# Patient Record
Sex: Female | Born: 1990 | Race: White | Hispanic: No | Marital: Single | Smoking: Never smoker
Health system: Southern US, Community
[De-identification: ages and names within clinical notes are randomized; demographics above are authoritative.]

## PROBLEM LIST (undated history)

## (undated) DIAGNOSIS — R202 Paresthesia of skin: Secondary | ICD-10-CM

## (undated) DIAGNOSIS — R251 Tremor, unspecified: Secondary | ICD-10-CM

## (undated) DIAGNOSIS — G43909 Migraine, unspecified, not intractable, without status migrainosus: Secondary | ICD-10-CM

## (undated) HISTORY — PX: OTHER SURGICAL HISTORY: SHX169

## (undated) HISTORY — PX: WISDOM TOOTH EXTRACTION: SHX21

## (undated) HISTORY — DX: Paresthesia of skin: R20.2

## (undated) HISTORY — DX: Migraine, unspecified, not intractable, without status migrainosus: G43.909

## (undated) HISTORY — DX: Tremor, unspecified: R25.1

---

## 2016-10-28 ENCOUNTER — Encounter: Payer: Self-pay | Admitting: Neurology

## 2016-10-28 ENCOUNTER — Ambulatory Visit (INDEPENDENT_AMBULATORY_CARE_PROVIDER_SITE_OTHER): Payer: BLUE CROSS/BLUE SHIELD | Admitting: Neurology

## 2016-10-28 VITALS — BP 90/60 | HR 98 | Ht 66.0 in | Wt 116.6 lb

## 2016-10-28 DIAGNOSIS — R202 Paresthesia of skin: Secondary | ICD-10-CM

## 2016-10-28 DIAGNOSIS — G44229 Chronic tension-type headache, not intractable: Secondary | ICD-10-CM | POA: Diagnosis not present

## 2016-10-28 DIAGNOSIS — R292 Abnormal reflex: Secondary | ICD-10-CM | POA: Diagnosis not present

## 2016-10-28 DIAGNOSIS — R5383 Other fatigue: Secondary | ICD-10-CM

## 2016-10-28 DIAGNOSIS — R5381 Other malaise: Secondary | ICD-10-CM | POA: Diagnosis not present

## 2016-10-28 NOTE — Patient Instructions (Addendum)
1.  MRI brain wwo contrast 2.  NCS/EMG of the arms 3.  We will call you with the results of your testing

## 2016-10-28 NOTE — Progress Notes (Signed)
[facility] Clinic Note - Initial Visit   Date: 10/28/16  Mary Andrews MRN: [id number] DOB: 1991-02-05   Dear Dr. Eather Colas:   Thank you for your kind referral of Mary Andrews for consultation of bilateral hand numbness. Although her history is well known to you, please allow [place] to reiterate it for the purpose of our medical record. The patient was accompanied to the clinic by self.    History of Present Illness: Mary Andrews is a 26 y.o. right-handed Caucasian female presenting for evaluation of bilateral hand numbness.    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  Around October 2017, she started having similar sensation over the forearm bilaterally and when this occurred, her legs would feel weak.  Symptoms are sporadic, occurring once every other month, lasting 1-2 hours, always occurring at work when she is standing and often in the early morning when she reaches work.  She does not have similar symptoms that wake her up from sleeping.  She does not have neck pain.  She does not have loss of consciousness or falls.  Symptoms are always symmetric and involve the hands and forearms in a circumferential manner.  She has not found anything that reduces the duration or intensity of symptoms.  She endorses having dull headaches.  No associated vision changes, nausea/vomiting.   She also complains of fatigue and sleepiness all the time.  She denies falling asleep during tasks such as watching TV or riding as a passenger in a car.  Fatigue has been present for many years.  She typically gets 7-10 hours of sleep nightly and reports to getting up 1-2 times per night because she is unable to falls asleep again.  She reports exercising 3 times per week.  She does not drink caffeinated beverages. Her mood is good.   Out-side paper records, electronic medical record, and images  have been reviewed where available and summarized as:  Labs 08/28/2016:  TSH 1.91, vitamin B12 498, vitamin D 34  Past Medical History:  None  Past Surgical History:  Procedure Laterality Date  . tubes in ears      Medications:  Outpatient Encounter Prescriptions as of 10/28/2016  Medication Sig  . Norgestim-Eth Estrad Triphasic (ORTHO TRI-CYCLEN, 28, PO) Take by mouth.   No facility-administered encounter medications on file as of 10/28/2016.      Allergies: No Known Allergies  Family History: Family History  Problem Relation Age of Onset  . Migraines Mother   . Asthma Brother   . Bipolar disorder Maternal Grandmother     Social History: Social History  Substance Use Topics  . Smoking status: Never Smoker  . Smokeless tobacco: Never Used  . Alcohol use Yes     Comment: Socially once per week   Social History   Social History Narrative   Lives in an apartment on the first floor.  Has no children.  Works as a Runner, broadcasting/film/video.  Education: college.    Review of Systems:  CONSTITUTIONAL: No fevers, chills, night sweats, or weight loss.   EYES: No visual changes or eye pain ENT: No hearing changes.  No history of nose bleeds.   RESPIRATORY: No cough, wheezing and shortness of breath.   CARDIOVASCULAR: Negative for chest pain, and palpitations.   GI: Negative for abdominal discomfort, blood in stools or black stools.  No recent change in bowel habits.  GU:  No history of incontinence.   MUSCLOSKELETAL: No history of joint pain or swelling.  No myalgias.   SKIN: Negative for lesions, rash, and itching.   HEMATOLOGY/ONCOLOGY: Negative for prolonged bleeding, bruising easily, and swollen nodes.  No history of cancer.   ENDOCRINE: Negative for cold or heat intolerance, polydipsia or goiter.   PSYCH:  No depression or anxiety symptoms.   NEURO: As Above.   Vital Signs:  BP 90/60   Pulse 98   Ht 5\' 6"  (1.676 m)   Wt 116 lb 9 oz (52.9 kg)   SpO2 99%   BMI 18.81 kg/m     General Medical Exam:   General:  Well appearing, comfortable.   Eyes/ENT: see cranial nerve examination.   Neck: No masses appreciated.  Full range of motion without tenderness.  No carotid bruits. Respiratory:  Clear to auscultation, good air entry bilaterally.   Cardiac:  Regular rate and rhythm, no murmur.   Extremities:  No deformities, edema, or skin discoloration.  Skin:  No rashes or lesions.  Neurological Exam: MENTAL STATUS including orientation to time, place, person, recent and remote memory, attention span and concentration, language, and fund of knowledge is normal.  Speech is not dysarthric.  CRANIAL NERVES: II:  No visual field defects.  Unremarkable fundi.   III-IV-VI: Pupils equal round and reactive to light.  Normal conjugate, extra-ocular eye movements in all directions of gaze.  No nystagmus.  No ptosis.   V:  Normal facial sensation.     VII:  Normal facial symmetry and movements.  VIII:  Normal hearing and vestibular function.   IX-X:  Normal palatal movement.   XI:  Normal shoulder shrug and head rotation.   XII:  Normal tongue strength and range of motion, no deviation or fasciculation.  MOTOR:  No atrophy, fasciculations or abnormal movements.  No pronator drift.  Tone is normal.    Right Upper Extremity:    Left Upper Extremity:    Deltoid  5/5   Deltoid  5/5   Biceps  5/5   Biceps  5/5   Triceps  5/5   Triceps  5/5   Wrist extensors  5/5   Wrist extensors  5/5   Wrist flexors  5/5   Wrist flexors  5/5   Finger extensors  5/5   Finger extensors  5/5   Finger flexors  5/5   Finger flexors  5/5   Dorsal interossei  5/5   Dorsal interossei  5/5   Abductor pollicis  5/5   Abductor pollicis  5/5   Tone (Ashworth scale)  0  Tone (Ashworth scale)  0   Right Lower Extremity:    Left Lower Extremity:    Hip flexors  5/5   Hip flexors  5/5   Hip extensors  5/5   Hip extensors  5/5   Knee flexors  5/5   Knee flexors  5/5   Knee extensors  5/5   Knee  extensors  5/5   Dorsiflexors  5/5   Dorsiflexors  5/5   Plantarflexors  5/5   Plantarflexors  5/5   Toe extensors  5/5   Toe extensors  5/5   Toe flexors  5/5   Toe flexors  5/5   Tone (Ashworth scale)  0  Tone (Ashworth scale)  0   MSRs:  Brisk 3+/4 reflexes throughout.  Plantars are down going.  There is no ankle clonus.  SENSORY:  Normal and symmetric perception of light  touch, pinprick, vibration, and proprioception.  Romberg's sign absent. Tinel's sign at the wrist and elbow is negative bilaterally.   COORDINATION/GAIT: Normal finger-to- nose-finger and heel-to-shin.  Intact rapid alternating movements bilaterally.  Able to rise from a chair without using arms.  Gait narrow based and stable. Tandem and stressed gait intact.    IMPRESSION: Mary Andrews is a 26 year-old female referred for evaluation of intermittent paresthesias of the upper extremities and chronic fatigue.  Her neurological exam shows brisk and symmetric reflexes throughout, which may be normal for patient's age, especially in the absence of other upper neuron findings. Motor and sensory exam is normal.  Her paresthesias are very diffuse and does not conform to a dermatomal or cutaneous nerve distribution.  Because of her patchy symptoms and brisk reflexes, MRI of the brain wwo contrast will be ordered to exclude demyelinating disease.  If this is negative, the next step is NCS/EMG of the upper extremities, but my overall suspicion for an entrapment neuropathy is low.    Further recommendations will be based on the results of her testing.   The duration of this appointment visit was 40 minutes of face-to-face time with the patient.  Greater than 50% of this time was spent in counseling, explanation of diagnosis, planning of further management, and coordination of care.   Thank you for allowing me to participate in patient's care.  If I can answer any additional questions, I would be pleased to do so.     Sincerely,    Donika K. Allena KatzPatel, DO

## 2016-11-05 ENCOUNTER — Telehealth: Payer: Self-pay | Admitting: *Deleted

## 2016-11-05 ENCOUNTER — Ambulatory Visit (INDEPENDENT_AMBULATORY_CARE_PROVIDER_SITE_OTHER): Payer: BLUE CROSS/BLUE SHIELD | Admitting: Neurology

## 2016-11-05 ENCOUNTER — Ambulatory Visit
Admission: RE | Admit: 2016-11-05 | Discharge: 2016-11-05 | Disposition: A | Discharge status: Home / Self Care | Payer: BLUE CROSS/BLUE SHIELD | Source: Ambulatory Visit | Attending: Neurology | Admitting: Neurology

## 2016-11-05 DIAGNOSIS — R5381 Other malaise: Secondary | ICD-10-CM

## 2016-11-05 DIAGNOSIS — R292 Abnormal reflex: Secondary | ICD-10-CM

## 2016-11-05 DIAGNOSIS — R202 Paresthesia of skin: Secondary | ICD-10-CM | POA: Diagnosis not present

## 2016-11-05 DIAGNOSIS — G44229 Chronic tension-type headache, not intractable: Secondary | ICD-10-CM

## 2016-11-05 DIAGNOSIS — R5383 Other fatigue: Secondary | ICD-10-CM

## 2016-11-05 MED ORDER — GADOBENATE DIMEGLUMINE 529 MG/ML IV SOLN
10.0000 mL | Freq: Once | INTRAVENOUS | Status: AC | PRN
  Administered 2016-11-05: 10 mL via INTRAVENOUS

## 2016-11-05 NOTE — Telephone Encounter (Signed)
Patient given the results and instructions.   

## 2016-11-05 NOTE — Procedures (Signed)
[facility]  [address], [address]  [place], [place] [address] Tel: [phone] Fax:  [phone] Test Date:  11/05/2016  Patient: Mary Andrews DOB: 04-Jun-1991 Physician: Mary Andrews  Sex: Female Height: 5\' 6"  Ref Phys: Mary Andrews  ID#: [id number] Temp: 33.2C Technician: Mary Andrews   Patient Complaints: This is a 26 year old female referred for evaluation of bilateral hand and forearm paresthesias.   NCV & EMG Findings: Extensive electrodiagnostic testing of the right upper extremity and additional studies of the left shows:  1. Bilateral median, ulnar, and mixed palmer sensory responses are within normal limits. 2. Bilateral median and ulnar motor responses are within normal limits. 3. There is no evidence of active or chronic motor axon loss changes affecting any of the tested muscles. Motor unit configuration and recruitment pattern is within normal limits.  Impression: This is a normal study. In particular, there is no evidence of carpal tunnel syndrome or cervical radiculopathy affecting the upper extremities.    ___________________________ Mary Andrews    Nerve Conduction Studies Anti Sensory Summary Table   Stim Site NR Peak (ms) Norm Peak (ms) P-T Amp (V) Norm P-T Amp  Left Median Anti Sensory (2nd Digit)  Wrist    3.2 <3.3 74.2 >20  Right Median Anti Sensory (2nd Digit)  32.3C  Wrist    3.1 <3.3 66.5 >20  Left Ulnar Anti Sensory (5th Digit)  32.3C  Wrist    2.6 <3.0 64.8 >18  Right Ulnar Anti Sensory (5th Digit)  32.3C  Wrist    2.8 <3.0 64.2 >18   Motor Summary Table   Stim Site NR Onset (ms) Norm Onset (ms) O-P Amp (mV) Norm O-P Amp Site1 Site2 Delta-0 (ms) Dist (cm) Vel (m/s) Norm Vel (m/s)  Left Median Motor (Abd Poll Brev)  Wrist    3.0 <3.9 8.2 >6 Elbow Wrist 3.5 21.0 60 >51  Elbow    6.5  8.2         Right Median Motor (Abd Poll Brev)  32.3C  Wrist    2.3 <3.9 12.3 >6 Elbow Wrist 4.5 24.0 53 >51  Elbow    6.8  12.3          Left Ulnar Motor (Abd Dig Minimi)  Wrist    2.7 <3.0 9.6 >8 B Elbow Wrist 2.7 15.0 56 >51  B Elbow    5.4  8.5  A Elbow B Elbow 1.6 10.0 63 >51  A Elbow    7.0  8.3         Right Ulnar Motor (Abd Dig Minimi)  32.3C  Wrist    2.9 <3.0 9.0 >8 B Elbow Wrist 3.0 16.0 53 >51  B Elbow    5.9  8.1  A Elbow B Elbow 1.6 10.0 63 >51  A Elbow    7.5  8.0          Comparison Summary Table   Stim Site NR Peak (ms) Norm Peak (ms) P-T Amp (V) Site1 Site2 Delta-P (ms) Norm Delta (ms)  Left Median/Ulnar Palm Comparison (Wrist - 8cm)  32.3C  Median Palm    1.5 <2.2 101.3 Median Palm Ulnar Palm 0.1   Ulnar Palm    1.4 <2.2 38.4      Right Median/Ulnar Palm Comparison (Wrist - 8cm)  32.3C  Median Palm    1.5 <2.2 85.1 Median Palm Ulnar Palm 0.1   Ulnar Palm    1.4 <2.2 17.3  EMG   Side Muscle Ins Act Fibs Psw Fasc Number Recrt Dur Dur. Amp Amp. Poly Poly. Comment  Right 1stDorInt Nml Nml Nml Nml Nml Nml Nml Nml Nml Nml Nml Nml N/A  Right Ext Indicis Nml Nml Nml Nml Nml Nml Nml Nml Nml Nml Nml Nml N/A  Right PronatorTeres Nml Nml Nml Nml Nml Nml Nml Nml Nml Nml Nml Nml N/A  Right Biceps Nml Nml Nml Nml Nml Nml Nml Nml Nml Nml Nml Nml N/A  Right Triceps Nml Nml Nml Nml Nml Nml Nml Nml Nml Nml Nml Nml N/A  Right Deltoid Nml Nml Nml Nml Nml Nml Nml Nml Nml Nml Nml Nml N/A  Left 1stDorInt Nml Nml Nml Nml Nml Nml Nml Nml Nml Nml Nml Nml N/A  Left Ext Indicis Nml Nml Nml Nml Nml Nml Nml Nml Nml Nml Nml Nml N/A  Left PronatorTeres Nml Nml Nml Nml Nml Nml Nml Nml Nml Nml Nml Nml N/A  Left Biceps Nml Nml Nml Nml Nml Nml Nml Nml Nml Nml Nml Nml N/A  Left Triceps Nml Nml Nml Nml Nml Nml Nml Nml Nml Nml Nml Nml N/A  Left Deltoid Nml Nml Nml Nml Nml Nml Nml Nml Nml Nml Nml Nml N/A      Waveforms:

## 2016-11-05 NOTE — Telephone Encounter (Signed)
-----   Message from [place] Chardonika K Patel, DO sent at 11/05/2016  2:41 PM EST ----- Please inform patient that her MRI brain and nerve testing is both normal. Please reassure her that there was no evidence of anything worrisome causing her symptoms. I would recommend to monitor her symptoms and return to clinic if they are worsening. Thanks.

## 2018-01-24 IMAGING — MR MR HEAD WO/W CM
11 series · 48 of 48 positions shown · IV contrast (multihance)
Comparison: None.

CLINICAL DATA: Tingling in the arms and hands over the last 18
months. Some lower extremity weakness.

EXAM:
MRI HEAD WITHOUT AND WITH CONTRAST
TECHNIQUE: Multiplanar, multiecho pulse sequences of the brain and surrounding
structures were obtained without and with intravenous contrast.
CONTRAST:  10mL MULTIHANCE GADOBENATE DIMEGLUMINE 529 MG/ML IV SOLN

[Series 2: T1 · sagittal · 5.0mm · 0.45mm/px · 1 of 21 slices shown]
[im 1/21]
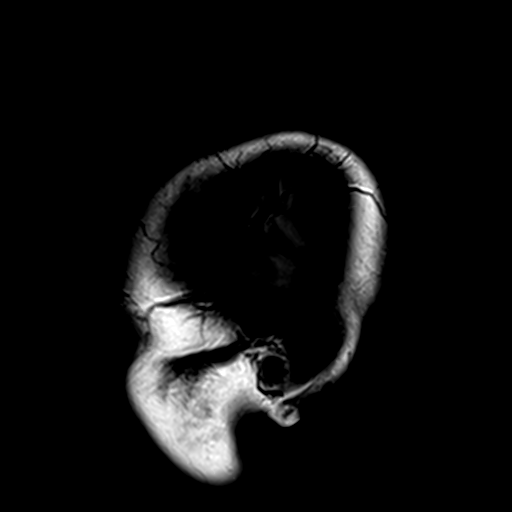

[Series 3: FLAIR · sagittal · 5.0mm · 0.45mm/px · 1 of 25 slices shown (1 of 2)]
[im 1/25]
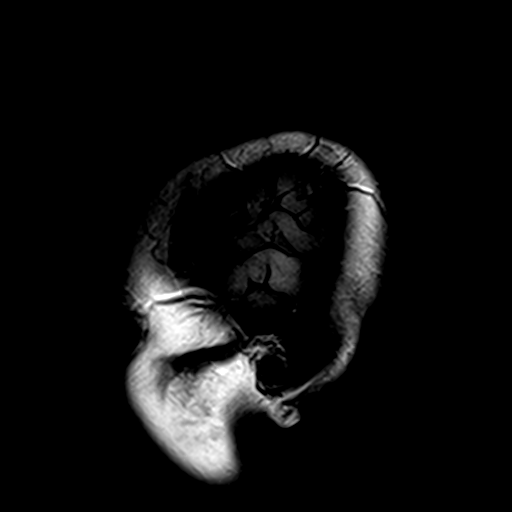

[Series 4: DWI · axial · 3.0mm · 1.80mm/px · z∈[-56,+91]mm · 7 of 100 slices shown (1 of 2)]
[im 1/100]
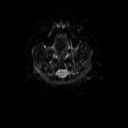
[im 17/100]
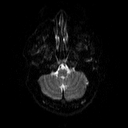
[im 34/100]
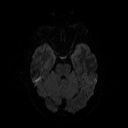
[im 50/100]
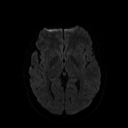
[im 67/100]
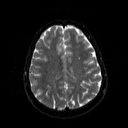
[im 83/100]
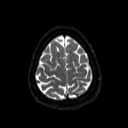
[im 100/100]
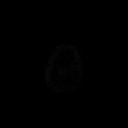

[Series 5: DWI · axial · 3.0mm · 1.80mm/px · z∈[-56,+91]mm · 4 of 50 slices shown (2 of 2)]
[im 1/50]
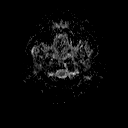
[im 17/50]
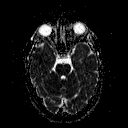
[im 33/50]
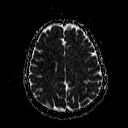
[im 50/50]
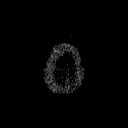

[Series 6: T2 · axial · 5.0mm · 0.51mm/px · z∈[-52,+89]mm · 2 of 22 slices shown (1 of 2)]
[im 1/22]
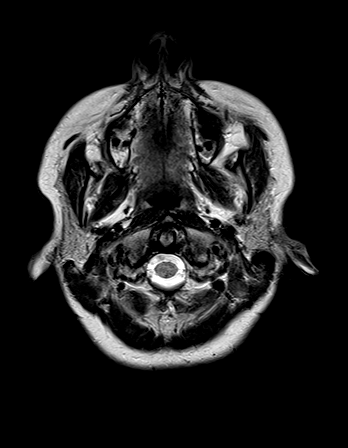
[im 22/22]
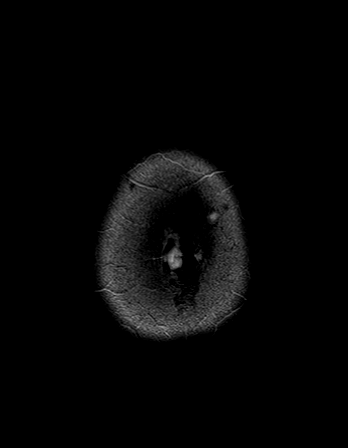

[Series 7: FLAIR · axial · 3.0mm · 0.45mm/px · z∈[-53,+88]mm · 3 of 48 slices shown (2 of 2)]
[im 1/48]
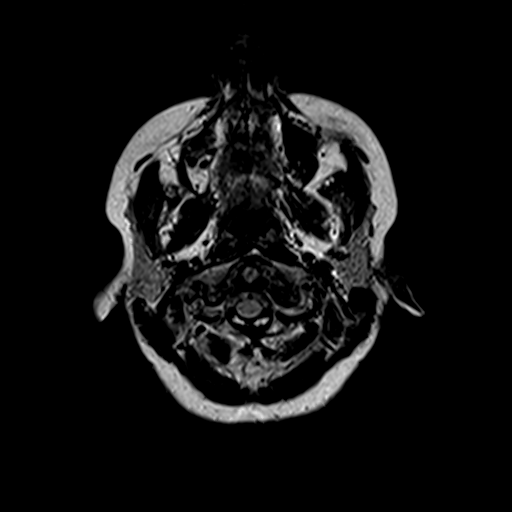
[im 24/48]
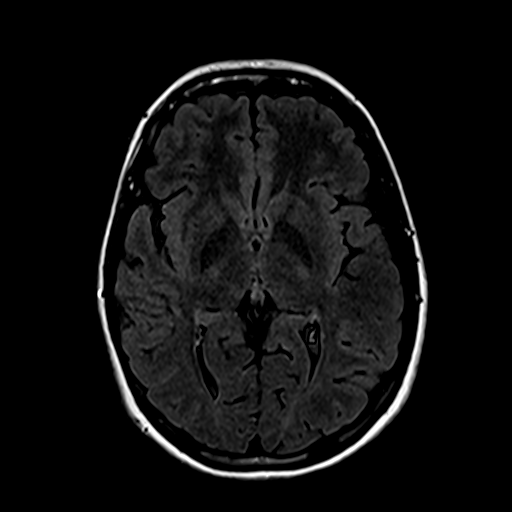
[im 48/48]
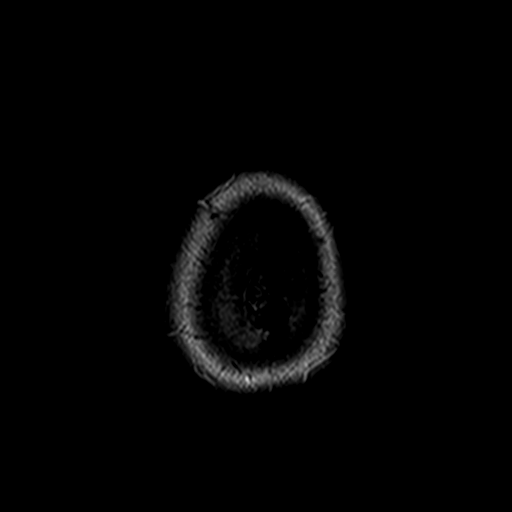

[Series 9: swi_images · axial · 2.0mm · 0.90mm/px · z∈[-61,+97]mm · 6 of 80 slices shown]
[im 1/80]
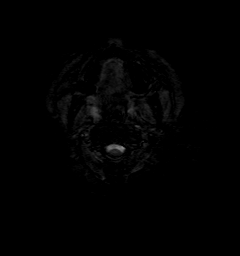
[im 16/80]
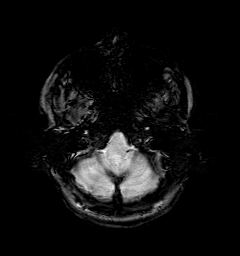
[im 32/80]
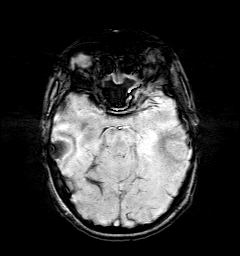
[im 48/80]
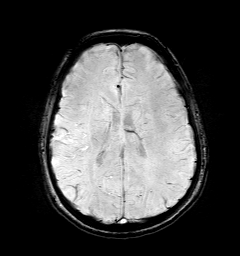
[im 64/80]
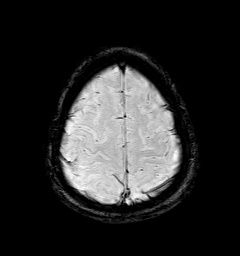
[im 80/80]
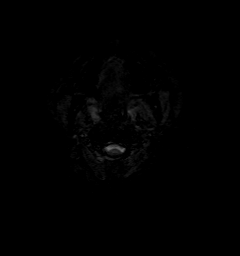

[Series 10: t1_mpr_tra · axial · 1.0mm · 0.75mm/px · z∈[-53,+90]mm · 10 of 144 slices shown (1 of 2)]
[im 1/144]
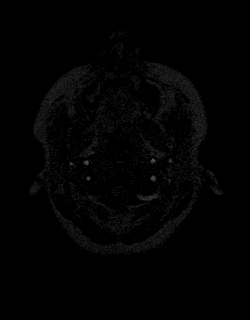
[im 16/144]
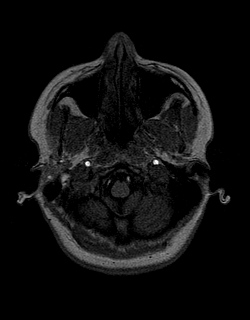
[im 32/144]
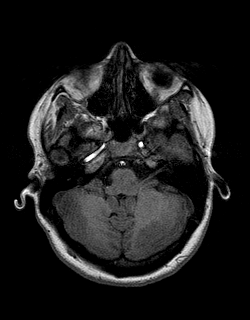
[im 48/144]
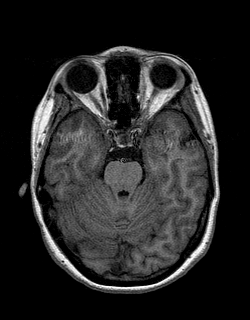
[im 64/144]
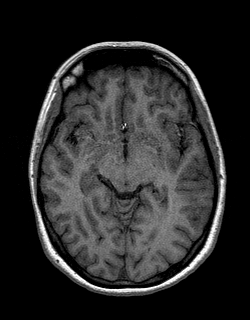
[im 80/144]
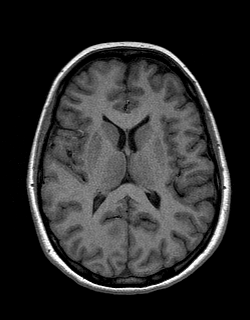
[im 96/144]
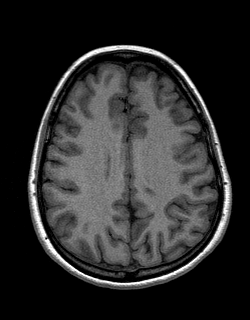
[im 112/144]
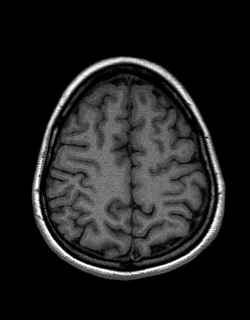
[im 128/144]
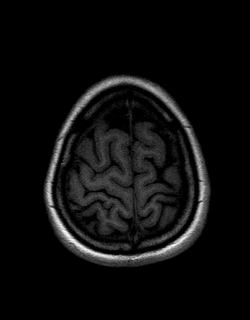
[im 144/144]
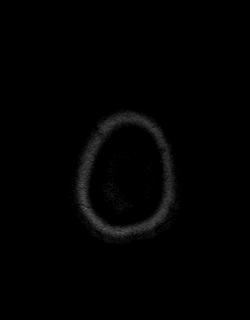

[Series 11: T2 · coronal · 5.0mm · 0.45mm/px · 2 of 25 slices shown (2 of 2)]
[im 1/25]
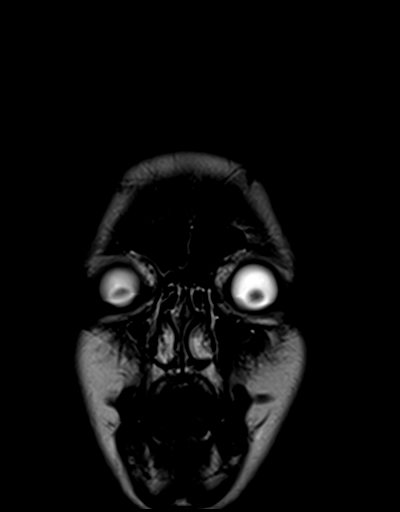
[im 25/25]
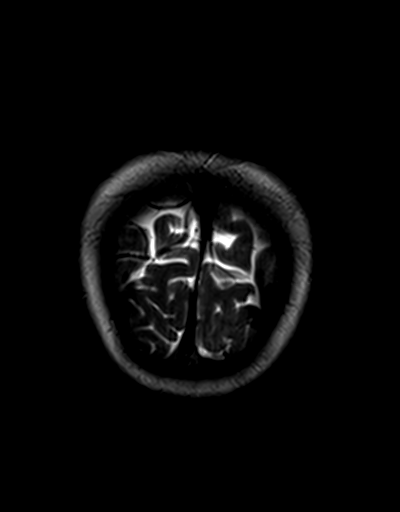

[Series 12: t1_mpr_tra · axial · 1.0mm · 0.75mm/px · z∈[-53,+90]mm · 10 of 144 slices shown (2 of 2)]
[im 1/144]
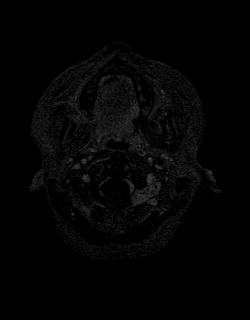
[im 16/144]
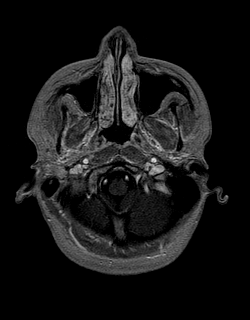
[im 32/144]
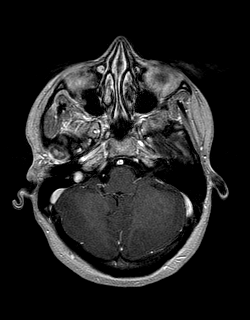
[im 48/144]
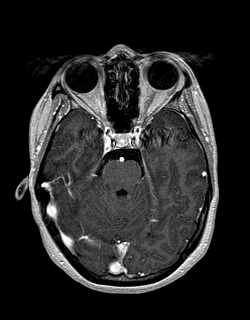
[im 64/144]
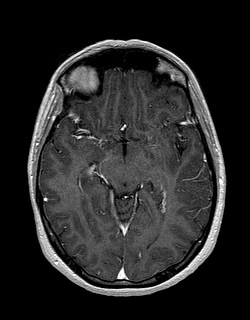
[im 80/144]
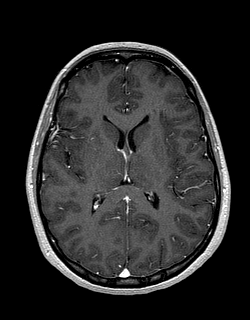
[im 96/144]
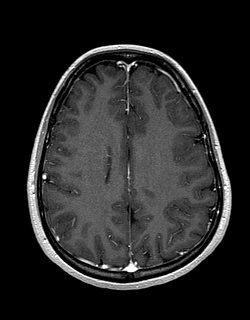
[im 112/144]
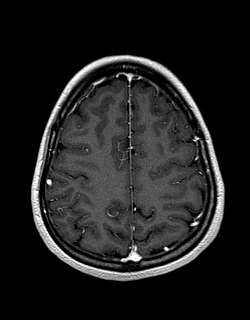
[im 128/144]
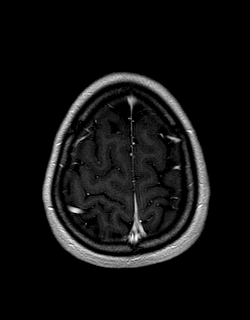
[im 144/144]
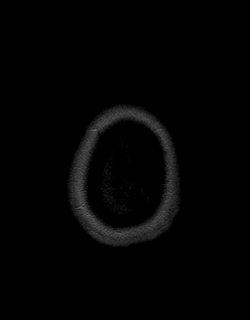

[Series 13: post cor · coronal · 5.0mm · 0.45mm/px · 2 of 25 slices shown]
[im 1/25]
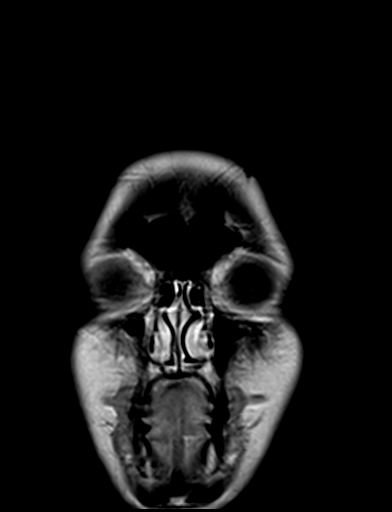
[im 25/25]
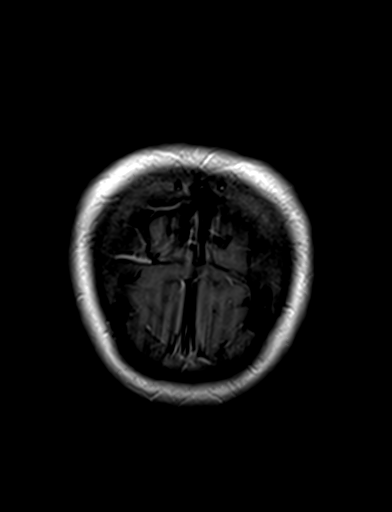

[48 of 48 positions shown; findings below may reference images not displayed]

FINDINGS: Brain: The brain has normal appearance without evidence of
malformation, atrophy, old or acute small or large vessel
infarction, hemorrhage, hydrocephalus or extra-axial collection. No
evidence of demyelinating disease. No pituitary abnormality. After
contrast administration, no abnormal enhancement occurs.

Vascular: Major vessels at the base of the brain show flow.

Skull and upper cervical spine: Normal

Sinuses/Orbits: Clear/ normal.

Other: None significant.
IMPRESSION: Normal examination. No abnormality seen to explain the presenting
symptoms.

## 2019-09-08 ENCOUNTER — Other Ambulatory Visit: Payer: Self-pay

## 2019-09-08 DIAGNOSIS — Z20822 Contact with and (suspected) exposure to covid-19: Secondary | ICD-10-CM

## 2019-09-11 LAB — NOVEL CORONAVIRUS, NAA: SARS-CoV-2, NAA: NOT DETECTED

## 2020-01-06 ENCOUNTER — Other Ambulatory Visit: Payer: Self-pay | Admitting: Family Medicine

## 2020-01-06 ENCOUNTER — Other Ambulatory Visit (HOSPITAL_COMMUNITY)
Admission: RE | Admit: 2020-01-06 | Discharge: 2020-01-06 | Disposition: A | Discharge status: Home / Self Care | Payer: BC Managed Care – PPO | Source: Ambulatory Visit | Attending: Family Medicine | Admitting: Family Medicine

## 2020-01-06 DIAGNOSIS — Z124 Encounter for screening for malignant neoplasm of cervix: Secondary | ICD-10-CM | POA: Insufficient documentation

## 2020-06-27 ENCOUNTER — Encounter: Payer: Self-pay | Admitting: Neurology

## 2020-06-27 ENCOUNTER — Ambulatory Visit: Payer: BC Managed Care – PPO | Admitting: Neurology

## 2020-06-27 ENCOUNTER — Other Ambulatory Visit: Payer: Self-pay

## 2020-06-27 VITALS — BP 110/78 | HR 69 | Ht 65.0 in | Wt 120.0 lb

## 2020-06-27 DIAGNOSIS — R251 Tremor, unspecified: Secondary | ICD-10-CM | POA: Diagnosis not present

## 2020-06-27 DIAGNOSIS — IMO0002 Reserved for concepts with insufficient information to code with codable children: Secondary | ICD-10-CM | POA: Insufficient documentation

## 2020-06-27 DIAGNOSIS — M79643 Pain in unspecified hand: Secondary | ICD-10-CM | POA: Insufficient documentation

## 2020-06-27 DIAGNOSIS — G43709 Chronic migraine without aura, not intractable, without status migrainosus: Secondary | ICD-10-CM | POA: Diagnosis not present

## 2020-06-27 NOTE — Progress Notes (Signed)
Chief Complaint  Patient presents with   New Patient (Initial Visit)    Reports tremor and pain in her bilateral hands (right worse than left). When the pain is present, she has difficulty gripping things.    PCP    Mary Soho, PA-C    HISTORICAL  Elleen Andrews, is a 29 year old right-handed female seen in request by her primary care PA Mary Andrews, for evaluation of constellation of complaints, intermittent bilateral hands tremor, hands joints pain, initial evaluation was on June 27, 2020  I reviewed and summarized the referring note.  Past medical history  Chronic migraine, she complains of frequent headaches couple times each week, take over-the-counter medication with limited help  Since 2019, she noticed intermittent bilateral hands tremor, but there was no limitation in her daily activity, she works at their [employer] officer, she denies difficulty using scissors, writing, there was no persistent sensory changes  She also complains of intermittent hands joints achiness, especially early morning, when she drives, she noticed knuckle pain, she has to straighten out her fingers, which she usually help her symptoms, she also complains of intermittent right fourth finger catch, but there was no significant pain, she denies significant joint swelling, she denies significant low back pain, shoulder blade area achiness, but denies radiating pain to bilateral upper extremity, no double vision, no bulbar weakness no gait abnormality Reported normal EMG nerve conduction study in 2017 from outside facility to evaluate for similar symptoms.  Laboratory evaluations in June 2021, mild elevated cholesterol, 205, LDL 120   REVIEW OF SYSTEMS: Full 14 system review of systems performed and notable only for as above All other review of systems were negative.  ALLERGIES: No Known Allergies  HOME MEDICATIONS: Current Outpatient Medications  Medication Sig Dispense Refill    Norgestim-Eth Estrad Triphasic (ORTHO TRI-CYCLEN, 28, PO) Take by mouth.     No current facility-administered medications for this visit.    PAST MEDICAL HISTORY: Past Medical History:  Diagnosis Date   Migraine    Paresthesia    Tremor     PAST SURGICAL HISTORY: Past Surgical History:  Procedure Laterality Date   tubes in ears     WISDOM TOOTH EXTRACTION      FAMILY HISTORY: Family History  Problem Relation Age of Onset   Migraines Mother    Arthritis Mother    Arthritis Father    Asthma Brother    Bipolar disorder Maternal Grandmother     SOCIAL HISTORY: Social History   Socioeconomic History   Marital status: Single    Spouse name: Not on file   Number of children: 0   Years of education: college   Highest education level: Not on file  Occupational History   Occupation: Runner, broadcasting/film/video  Tobacco Use   Smoking status: Never Smoker   Smokeless tobacco: Never Used  Substance and Sexual Activity   Alcohol use: Yes    Comment: social use   Drug use: Never   Sexual activity: Not on file  Other Topics Concern   Not on file  Social History Narrative   Lives alone.   Right-handed.   No daily use of caffeine.    Social Determinants of Health   Financial Resource Strain:    Difficulty of Paying Living Expenses: Not on file  Food Insecurity:    Worried About Programme researcher, broadcasting/film/video in the Last Year: Not on file   [employer] of Food in the Last Year: Not on file  Transportation Needs:  Lack of Transportation (Medical): Not on file   Lack of Transportation (Non-Medical): Not on file  Physical Activity:    Days of Exercise per Week: Not on file   Minutes of Exercise per Session: Not on file  Stress:    Feeling of Stress : Not on file  Social Connections:    Frequency of Communication with Friends and Family: Not on file   Frequency of Social Gatherings with Friends and Family: Not on file   Attends Religious Services: Not on file    Active Member of Clubs or Organizations: Not on file   Attends Banker Meetings: Not on file   Marital Status: Not on file  Intimate Partner Violence:    Fear of Current or Ex-Partner: Not on file   Emotionally Abused: Not on file   Physically Abused: Not on file   Sexually Abused: Not on file     PHYSICAL EXAM   Vitals:   06/27/20 1517  BP: 110/78  Pulse: 69  Weight: 120 lb (54.4 kg)  Height: 5\' 5"  (1.651 m)   Not recorded     Body mass index is 19.97 kg/m.  PHYSICAL EXAMNIATION:  Gen: NAD, conversant, well nourised, well groomed                     Cardiovascular: Regular rate rhythm, no peripheral edema, warm, nontender. Eyes: Conjunctivae clear without exudates or hemorrhage Neck: Supple, no carotid bruits. Pulmonary: Clear to auscultation bilaterally   NEUROLOGICAL EXAM:  MENTAL STATUS: Speech:    Speech is normal; fluent and spontaneous with normal comprehension.  Cognition:     Orientation to time, place and person     Normal recent and remote memory     Normal Attention span and concentration     Normal Language, naming, repeating,spontaneous speech     Fund of knowledge   CRANIAL NERVES: CN II: Visual fields are full to confrontation. Pupils are round equal and briskly reactive to light. CN III, IV, VI: extraocular movement are normal. No ptosis. CN V: Facial sensation is intact to light touch CN VII: Face is symmetric with normal eye closure  CN VIII: Hearing is normal to causal conversation. CN IX, X: Phonation is normal. CN XI: Head turning and shoulder shrug are intact  MOTOR: There is no pronator drift of out-stretched arms. Muscle bulk and tone are normal. Muscle strength is normal.  No joint swelling  REFLEXES: Reflexes are 2+ and symmetric at the biceps, triceps, knees, and ankles. Plantar responses are flexor.  SENSORY: Intact to light touch, pinprick and vibratory sensation are intact in fingers and  toes.  COORDINATION: There is no trunk or limb dysmetria noted.  GAIT/STANCE: Posture is normal. Gait is steady with normal steps, base, arm swing, and turning. Heel and toe walking are normal. Tandem gait is normal.  Romberg is absent.   DIAGNOSTIC DATA (LABS, IMAGING, TESTING) - I reviewed patient records, labs, notes, testing and imaging myself where available.   ASSESSMENT AND PLAN  Mary Andrews is a 29 y.o. 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 Ph.D.  [facility] [address] [place], [place] [address] Ph: [phone] Fax: [phone]  CC:  Mary Soho, PA-C [address] [place],  [place] [address]

## 2020-06-27 NOTE — Patient Instructions (Signed)
Magnesium oxide 400 mg twice a day Riboflavin= vitamin B2 100 mg twice a day as migraine prevention  Aleve as needed for moderate to severe headaches

## 2021-12-13 ENCOUNTER — Emergency Department (HOSPITAL_BASED_OUTPATIENT_CLINIC_OR_DEPARTMENT_OTHER)
Admission: EM | Admit: 2021-12-13 | Discharge: 2021-12-13 | Disposition: A | Discharge status: Home / Self Care | Payer: BC Managed Care – PPO | Attending: Emergency Medicine | Admitting: Emergency Medicine

## 2021-12-13 ENCOUNTER — Other Ambulatory Visit: Payer: Self-pay

## 2021-12-13 ENCOUNTER — Encounter (HOSPITAL_BASED_OUTPATIENT_CLINIC_OR_DEPARTMENT_OTHER): Payer: Self-pay

## 2021-12-13 ENCOUNTER — Emergency Department (HOSPITAL_BASED_OUTPATIENT_CLINIC_OR_DEPARTMENT_OTHER): Payer: BC Managed Care – PPO

## 2021-12-13 DIAGNOSIS — R519 Headache, unspecified: Secondary | ICD-10-CM | POA: Diagnosis present

## 2021-12-13 DIAGNOSIS — G43001 Migraine without aura, not intractable, with status migrainosus: Secondary | ICD-10-CM | POA: Insufficient documentation

## 2021-12-13 DIAGNOSIS — R202 Paresthesia of skin: Secondary | ICD-10-CM | POA: Insufficient documentation

## 2021-12-13 NOTE — ED Provider Notes (Signed)
?[facility] EMERGENCY DEPARTMENT ?Provider Note ? ? ?CSN: [id number] ?Arrival date & time: 12/13/21  1639 ? ?  ? ?History ? ?Chief Complaint  ?Patient presents with  ? Migraine  ? ? ?Japji Brathwaite is a 31 y.o. female. ? ?Patient is a 31 year old female with a significant past medical history of recurrent migraines, paresthesias and tremors who is presenting today with complaints of pain and tingling over the right side of her cheek and chin.  She reports that the symptoms started yesterday but have been more pronounced today.  She also reported yesterday she started getting a migraine across her forehead and now it is seem to settle over in her right temple.  She reports the symptoms with the face are unusual and she has never had that before.  She typically gets 2-3 migraines per week and reports on Sunday her migraine had a lot of wavy lines in her vision which is also a bit atypical but that had resolved until the symptoms that started yesterday.  She has not had any congestion, fever, swelling of her face.  She denies any tooth pain.  No new neck pain.  She spoke with her headache doctor today but they requested that she come here and have a scan to ensure that everything is normal before they give her specific medication for her migraine.  She denies any numbness or weakness in her arm.  She reported earlier today she had some pain in her leg but that has resolved and it is no longer present at this time.  She has had no trauma to her head. ? ?The history is provided by the patient.  ?Migraine ? ? ?  ? ?Home Medications ?Prior to Admission medications   ?Medication Sig Start Date End Date Taking? Authorizing Provider  ?Norgestim-Eth Estrad Triphasic (ORTHO TRI-CYCLEN, 28, PO) Take by mouth.    [provider]  ?   ? ?Allergies    ?Patient has no known allergies.   ? ?Review of Systems   ?Review of Systems ? ?Physical Exam ?Updated Vital Signs ?BP 125/90   Pulse 61   Temp 98.5 ?F (36.9 ?C)  (Oral)   Resp 18   Ht 5\' 7"  (1.702 m)   Wt 59 kg   LMP 12/13/2021   SpO2 100%   BMI 20.36 kg/m?  ?Physical Exam ?Vitals and nursing note reviewed.  ?Constitutional:   ?   General: She is not in acute distress. ?   Appearance: She is well-developed.  ?HENT:  ?   Head: Normocephalic and atraumatic.  ?   Comments: Mild tenderness with palpation to the right cheek and chin, no swelling, rashes or erythema noted ?   Right Ear: Tympanic membrane normal.  ?   Left Ear: Tympanic membrane normal.  ?   Nose: Nose normal.  ?   Mouth/Throat:  ?   Mouth: Mucous membranes are moist.  ?Eyes:  ?   General:     ?   Right eye: No discharge.     ?   Left eye: No discharge.  ?   Conjunctiva/sclera: Conjunctivae normal.  ?   Pupils: Pupils are equal, round, and reactive to light.  ?   Comments: photophobia  ?Neck:  ?   Meningeal: Brudzinski's sign and Kernig's sign absent.  ?Cardiovascular:  ?   Rate and Rhythm: Normal rate and regular rhythm.  ?   Heart sounds: Normal heart sounds. No murmur heard. ?  No friction rub.  ?Pulmonary:  ?  Effort: Pulmonary effort is normal. No respiratory distress.  ?   Breath sounds: Normal breath sounds. No wheezing or rales.  ?Abdominal:  ?   General: Bowel sounds are normal. There is no distension.  ?   Palpations: Abdomen is soft.  ?   Tenderness: There is no abdominal tenderness. There is no guarding or rebound.  ?Musculoskeletal:     ?   General: No tenderness. Normal range of motion.  ?   Cervical back: Normal range of motion and neck supple. No rigidity. No spinous process tenderness.  ?   Comments: No edema  ?Lymphadenopathy:  ?   Cervical: No cervical adenopathy.  ?Skin: ?   General: Skin is warm and dry.  ?   Capillary Refill: Capillary refill takes less than 2 seconds.  ?   Findings: No rash.  ?Neurological:  ?   Mental Status: She is alert and oriented to person, place, and time. Mental status is at baseline.  ?   GCS: GCS eye subscore is 4. GCS verbal subscore is 5. GCS motor subscore  is 6.  ?   Cranial Nerves: No cranial nerve deficit.  ?   Sensory: No sensory deficit.  ?   Motor: No weakness.  ?   Coordination: Coordination normal.  ?   Gait: Gait normal.  ?Psychiatric:     ?   Behavior: Behavior normal.  ? ? ?ED Results / Procedures / Treatments   ?Labs ?(all labs ordered are listed, but only abnormal results are displayed) ?Labs Reviewed - No data to display ? ?EKG ?None ? ?Radiology ?CT Head Wo Contrast ? ?Result Date: 12/13/2021 ?CLINICAL DATA:  Headache. EXAM: CT HEAD WITHOUT CONTRAST TECHNIQUE: Contiguous axial images were obtained from the base of the skull through the vertex without intravenous contrast. RADIATION DOSE REDUCTION: This exam was performed according to the departmental dose-optimization program which includes automated exposure control, adjustment of the mA and/or kV according to patient size and/or use of iterative reconstruction technique. COMPARISON:  None. FINDINGS: Brain: No evidence of acute infarction, hemorrhage, hydrocephalus, extra-axial collection or mass lesion/mass effect. Vascular: No hyperdense vessel or unexpected calcification. Skull: Normal. Negative for fracture or focal lesion. Sinuses/Orbits: No acute finding. Other: None. IMPRESSION: No acute intracranial abnormality seen. Electronically Signed   By: Lupita RaiderJames  Green Jr M.D.   On: 12/13/2021 20:22   ? ?Procedures ?Procedures  ? ? ?Medications Ordered in ED ?Medications - No data to display ? ?ED Course/ Medical Decision Making/ A&P ?  ?                        ?Medical Decision Making ?Amount and/or Complexity of Data Reviewed ?Radiology: ordered. ? ? ?Patient with a history of migraine headaches presenting today with atypical symptoms of a feeling like a burning pain in the right side of her face that does not cross midline.  As well as a headache.  She has no specific visual complaints at this time.  Vital signs are within normal limits and low suspicion at this time serve for subarachnoid hemorrhage,  venous dural thrombosis or meningitis.  Patient denies any sinus symptoms and low suspicion for orbital cellulitis or sinusitis causing her symptoms.  At this time no rashes present however did caution the patient to keep an eye out for rashes this could be early zoster.  Patient has a doctor who will be prescribing her medication for her headache but she was sent here to have a scan  of her head.  Patient does not wish to have any medications for her headache at this time. ? ?9:04 PM ?I independently visualized and interpreted patient's head CT with no evidence of acute bleed.  Radiology reports no acute findings.  These findings were discussed with the patient.  At this time she wishes to follow-up with her specialist tomorrow.  She does not want any treatment of her headache today.  She is otherwise neurovascularly intact and well-appearing. ? ? ? ? ? ? ? ?Final Clinical Impression(s) / ED Diagnoses ?Final diagnoses:  ?Migraine without aura and with status migrainosus, not intractable  ?Facial paresthesia  ? ? ?Rx / DC Orders ?ED Discharge Orders   ? ? None  ? ?  ? ? ?  ?Gwyneth Sprout, MD ?12/13/21 2105 ? ?

## 2021-12-13 NOTE — ED Triage Notes (Addendum)
Pt c/o migraine with vision changes and pain site changesstarted 3/5-yesterday c/o numbness/tingling to right side of face/chin-seen/sent by PCP-NAD-steady gait ?

## 2021-12-13 NOTE — Discharge Instructions (Signed)
Keep an eye out that you do not develop a rash on your face.  If you do you need to be seen by your doctor to get treatment for shingles. ?

## 2022-12-10 ENCOUNTER — Encounter: Payer: Self-pay | Admitting: Internal Medicine

## 2022-12-10 ENCOUNTER — Ambulatory Visit: Payer: BC Managed Care – PPO | Admitting: Internal Medicine

## 2022-12-10 VITALS — BP 121/75 | HR 76 | Ht 67.0 in | Wt 130.4 lb

## 2022-12-10 DIAGNOSIS — R000 Tachycardia, unspecified: Secondary | ICD-10-CM

## 2022-12-10 DIAGNOSIS — R7982 Elevated C-reactive protein (CRP): Secondary | ICD-10-CM

## 2022-12-10 DIAGNOSIS — R002 Palpitations: Secondary | ICD-10-CM

## 2022-12-10 DIAGNOSIS — R0609 Other forms of dyspnea: Secondary | ICD-10-CM

## 2022-12-10 MED ORDER — PROPRANOLOL HCL 10 MG PO TABS: 10.0000 mg | ORAL_TABLET | Freq: Three times a day (TID) | ORAL | 10 refills | Status: AC | PRN

## 2022-12-10 NOTE — Progress Notes (Signed)
Primary Physician/Referring:  Marda Stalker, PA-C  Patient ID: Mary Andrews, female    DOB: [birth year], 32 y.o.   MRN: [id number]  Chief Complaint  Patient presents with   Tachycardia   Shortness of Breath   New Patient (Initial Visit)        HPI:    Mary Andrews  is a 32 y.o. female with past medical history significant for migraines who is here to establish care with cardiology. She has been complaining of shortness of breath with exertion, tachycardia, and palpitations. She has been feeling this way since having covid many years ago. She feels like she still can't catch her breath all the way. She notices when she is at work and walks only a few feet, her heart rates go into the 120s. She denies cardiac history in herself. Her grandfather had Marfan's disease (I presume) as he had an aortic dissection when he was 31 years old. There is also family history of rheumatoid arthritis but patient herself has not been diagnosed with autoimmune disease. Patient denies chest pain, diaphoresis, syncope, edema, PND, orthopnea.   Past Medical History:  Diagnosis Date   Migraine    Paresthesia    Tremor    Past Surgical History:  Procedure Laterality Date   tubes in ears     WISDOM TOOTH EXTRACTION     Family History  Problem Relation Age of Onset   Migraines Mother    Arthritis Mother    Arthritis Father    Asthma Brother    Bipolar disorder Maternal Grandmother     Social History   Tobacco Use   Smoking status: Never   Smokeless tobacco: Never  Substance Use Topics   Alcohol use: Yes    Comment: weekly   Marital Status: Single  ROS  Review of Systems  Cardiovascular:  Positive for dyspnea on exertion, irregular heartbeat and palpitations.   Objective  Blood pressure 121/75, pulse 76, height '5\' 7"'$  (1.702 m), weight 130 lb 6.4 oz (59.1 kg), SpO2 94 %. Body mass index is 20.42 kg/m.     12/10/2022    2:53 PM 12/13/2021    9:19 PM 12/13/2021    8:00 PM  Vitals with BMI   Height '5\' 7"'$     Weight 130 lbs 6 oz    BMI [id number]    Systolic [id number] [id number]  Diastolic 75 86 90  Pulse 76 66 61     Physical Exam Vitals reviewed.  HENT:     Head: Normocephalic and atraumatic.  Cardiovascular:     Rate and Rhythm: Normal rate and regular rhythm.     Pulses: Normal pulses.     Heart sounds: Normal heart sounds. No murmur heard. Pulmonary:     Effort: Pulmonary effort is normal.     Breath sounds: Normal breath sounds.  Abdominal:     General: Bowel sounds are normal.  Musculoskeletal:     Right lower leg: No edema.     Left lower leg: No edema.  Skin:    General: Skin is warm and dry.  Neurological:     Mental Status: She is alert.     Medications and allergies  No Known Allergies   Medication list after today's encounter   Current Outpatient Medications:    Lysine HCl 1000 MG TABS, 1 tablet Orally Once a day for 30 day(s), Disp: , Rfl:    Norgestim-Eth Estrad Triphasic (ORTHO TRI-CYCLEN, 28, PO), Take by mouth., Disp: , Rfl:    propranolol (INDERAL)  10 MG tablet, Take 1 tablet (10 mg total) by mouth 3 (three) times daily as needed., Disp: 90 tablet, Rfl: 10   VALTREX 1 g tablet, Take 500 mg by mouth as needed., Disp: , Rfl:   Laboratory examination:   No results found for: "NA", "K", "CO2", "GLUCOSE", "BUN", "CREATININE", "CALCIUM", "EGFR", "GFRNONAA"      No data to display             No data to display          Lipid Panel No results for input(s): "CHOL", "TRIG", "LDLCALC", "VLDL", "HDL", "CHOLHDL", "LDLDIRECT" in the last 8760 hours.  HEMOGLOBIN A1C No results found for: "HGBA1C", "MPG" TSH No results for input(s): "TSH" in the last 8760 hours.  External labs:   TSH normal ESR normal CRP elevated @ 36   Radiology:    Cardiac Studies:     EKG:   12/10/2022: Sinus Rhythm, rate 73 bpm. Normal axis, no ischemia.  Assessment     ICD-10-CM   1. DOE (dyspnea on exertion)  R06.09 EKG 12-Lead    PCV ECHOCARDIOGRAM  COMPLETE    Extractable Nuclear antigen ab    Jo-1 antibody-IgG    Lupus Anticoag/Cardiolipin Ab    Lupus (SLE) Analysis    Rheumatoid Arthritis Profile    Sjogren's syndrome antibods(ssa + ssb)    CYCLIC CITRUL PEPTIDE ANTIBODY, IGG/IGA    2. Palpitations  R00.2 EKG 12-Lead    PCV ECHOCARDIOGRAM COMPLETE    Extractable Nuclear antigen ab    Jo-1 antibody-IgG    Lupus Anticoag/Cardiolipin Ab    Lupus (SLE) Analysis    Rheumatoid Arthritis Profile    Sjogren's syndrome antibods(ssa + ssb)    CYCLIC CITRUL PEPTIDE ANTIBODY, IGG/IGA    3. Tachycardia  R00.0 PCV ECHOCARDIOGRAM COMPLETE    Extractable Nuclear antigen ab    Jo-1 antibody-IgG    Lupus Anticoag/Cardiolipin Ab    Lupus (SLE) Analysis    Rheumatoid Arthritis Profile    Sjogren's syndrome antibods(ssa + ssb)    CYCLIC CITRUL PEPTIDE ANTIBODY, IGG/IGA    4. Elevated C-reactive protein (CRP)  R79.82 Ambulatory referral to Rheumatology       Orders Placed This Encounter  Procedures   Extractable Nuclear antigen ab   Jo-1 antibody-IgG   Lupus Anticoag/Cardiolipin Ab   Lupus (SLE) Analysis   Rheumatoid Arthritis Profile   Sjogren's syndrome antibods(ssa + ssb)   CYCLIC CITRUL PEPTIDE ANTIBODY, IGG/IGA   Ambulatory referral to Rheumatology    Referral Priority:   Routine    Referral Type:   Consultation    Referral Reason:   Specialty Services Required    Requested Specialty:   Rheumatology    Number of Visits Requested:   1   EKG 12-Lead   PCV ECHOCARDIOGRAM COMPLETE    Standing Status:   Future    Standing Expiration Date:   12/10/2023    Meds ordered this encounter  Medications   propranolol (INDERAL) 10 MG tablet    Sig: Take 1 tablet (10 mg total) by mouth 3 (three) times daily as needed.    Dispense:  90 tablet    Refill:  10    There are no discontinued medications.   Recommendations:   Mary Andrews is a 32 y.o.  female with DOE, palpitations, and tachycardia  DOE (dyspnea on  exertion) Echocardiogram ordered  Elevated CRP Rheumatology referral provided Autoimmune lab panels ordered  Palpitations Orthostatic vital signs are negative, unlikely that she has POTS  Tachycardia Can try Propranolol 10 mg TID PRN Follow-up in 1-2 months or sooner if needed    Mary Flock, DO, [facility]  12/10/2022, 3:50 PM Office: [phone] Pager: [phone]

## 2022-12-20 ENCOUNTER — Ambulatory Visit: Payer: BC Managed Care – PPO

## 2022-12-20 ENCOUNTER — Telehealth: Payer: Self-pay | Admitting: *Deleted

## 2022-12-20 DIAGNOSIS — R0609 Other forms of dyspnea: Secondary | ICD-10-CM

## 2022-12-20 DIAGNOSIS — R000 Tachycardia, unspecified: Secondary | ICD-10-CM

## 2022-12-20 DIAGNOSIS — R002 Palpitations: Secondary | ICD-10-CM

## 2022-12-20 NOTE — Telephone Encounter (Signed)
Patient stated she received a text to call to schedule an appt. Please advise. Thank you.

## 2022-12-20 NOTE — Telephone Encounter (Signed)
error 

## 2022-12-23 NOTE — Progress Notes (Signed)
Gave patient results, she acknowledged understanding and had no further questions.

## 2022-12-23 NOTE — Telephone Encounter (Signed)
Reached out to advise patient that referral was still in review and that additional labs were needed. Patient states that she had labs done last week and will have them faxed to our office. Advised patient that we would reach out once labs had been received and referral reviewed.

## 2022-12-23 NOTE — Progress Notes (Signed)
normal

## 2022-12-26 LAB — LUPUS (SLE) ANALYSIS
Anti Nuclear Antibody (ANA): NEGATIVE
Anti-striation Abs: NEGATIVE
Complement C4, Serum: 24 mg/dL (ref 12–38)
ENA RNP Ab: <0.2 AI (ref 0.0–0.9)
ENA SM Ab Ser-aCnc: <0.2 AI (ref 0.0–0.9)
ENA SSA (RO) Ab: <0.2 AI (ref 0.0–0.9)
ENA SSB (LA) Ab: <0.2 AI (ref 0.0–0.9)
Mitochondrial Ab: <20 Units (ref 0.0–20.0)
Parietal Cell Ab: 1.7 Units (ref 0.0–20.0)
Scleroderma (Scl-70) (ENA) Antibody, IgG: <0.2 AI (ref 0.0–0.9)
Smooth Muscle Ab: 12 Units (ref 0–19)
Thyroperoxidase Ab SerPl-aCnc: 15 IU/mL (ref 0–34)
dsDNA Ab: <1 IU/mL (ref 0–9)

## 2022-12-26 LAB — LUPUS ANTICOAG/CARDIOLIPIN AB
APTT: 26.5 s
Anticardiolipin Ab, IgG: 13 [GPL'U]
Anticardiolipin Ab, IgM: <10 [MPL'U]
Beta-2 Glycoprotein I, IgA: <10 SAU
Beta-2 Glycoprotein I, IgG: <10 SGU
Beta-2 Glycoprotein I, IgM: <10 SMU
DRVVT Screen Seconds: 37.5 s
Hexagonal Phospholipid Neutral: 4 s
INR: 1 ratio
Prothrombin Time: 10.5 s
Thrombin Time: 17.6 s

## 2022-12-26 LAB — RHEUMATOID ARTHRITIS PROFILE
Cyclic Citrullin Peptide Ab: 7 units (ref 0–19)
Rheumatoid fact SerPl-aCnc: <10 IU/mL (ref <14.0)

## 2023-01-21 ENCOUNTER — Ambulatory Visit: Payer: BC Managed Care – PPO | Admitting: Internal Medicine

## 2023-01-22 ENCOUNTER — Ambulatory Visit: Payer: BC Managed Care – PPO | Admitting: Internal Medicine

## 2023-01-24 ENCOUNTER — Ambulatory Visit: Payer: BC Managed Care – PPO | Admitting: Internal Medicine

## 2023-01-27 ENCOUNTER — Ambulatory Visit: Payer: BC Managed Care – PPO | Admitting: Internal Medicine

## 2023-01-27 VITALS — BP 119/77 | HR 70 | Ht 67.0 in | Wt 131.0 lb

## 2023-01-27 DIAGNOSIS — R000 Tachycardia, unspecified: Secondary | ICD-10-CM

## 2023-01-27 DIAGNOSIS — R0609 Other forms of dyspnea: Secondary | ICD-10-CM

## 2023-01-27 DIAGNOSIS — R002 Palpitations: Secondary | ICD-10-CM

## 2023-01-27 DIAGNOSIS — R7982 Elevated C-reactive protein (CRP): Secondary | ICD-10-CM

## 2023-01-27 NOTE — Progress Notes (Unsigned)
Primary Physician/Referring:  Jarrett Soho, PA-C  Patient ID: Mary Andrews, female    DOB: 1991/01/26, 32 y.o.   MRN: [id number]  Chief Complaint  Patient presents with   Shortness of Breath   Follow-up   Results   HPI:    Mary Andrews  is a 32 y.o. female with past medical history significant for migraines who is here to establish care with cardiology. She has been complaining of shortness of breath with exertion, tachycardia, and palpitations. She has been feeling this way since having covid many years ago. She feels like she still can't catch her breath all the way. She notices when she is at work and walks only a few feet, her heart rates go into the 120s. She denies cardiac history in herself. Her grandfather had Marfan's disease (I presume) as he had an aortic dissection when he was 31 years old. There is also family history of rheumatoid arthritis but patient herself has not been diagnosed with autoimmune disease. Patient denies chest pain, diaphoresis, syncope, edema, PND, orthopnea.   Past Medical History:  Diagnosis Date   Migraine    Paresthesia    Tremor    Past Surgical History:  Procedure Laterality Date   tubes in ears     WISDOM TOOTH EXTRACTION     Family History  Problem Relation Age of Onset   Migraines Mother    Arthritis Mother    Arthritis Father    Asthma Brother    Bipolar disorder Maternal Grandmother     Social History   Tobacco Use   Smoking status: Never   Smokeless tobacco: Never  Substance Use Topics   Alcohol use: Yes    Comment: weekly   Marital Status: Single  ROS  Review of Systems  Cardiovascular:  Positive for dyspnea on exertion, irregular heartbeat and palpitations.   Objective  Blood pressure 119/77, pulse 70, height  (1.702 m), weight 131 lb (59.4 kg), SpO2 99 %. Body mass index is 20.52 kg/m.     01/27/2023    2:59 PM 12/10/2022    2:53 PM 12/13/2021    9:19 PM  Vitals with BMI  Height     Weight 131  lbs 130 lbs 6 oz   BMI 20.51 20.42   Systolic 119 121 132  Diastolic 77 75 86  Pulse 70 76 66     Physical Exam Vitals reviewed.  HENT:     Head: Normocephalic and atraumatic.  Cardiovascular:     Rate and Rhythm: Normal rate and regular rhythm.     Pulses: Normal pulses.     Heart sounds: Normal heart sounds. No murmur heard. Pulmonary:     Effort: Pulmonary effort is normal.     Breath sounds: Normal breath sounds.  Abdominal:     General: Bowel sounds are normal.  Musculoskeletal:     Right lower leg: No edema.     Left lower leg: No edema.  Skin:    General: Skin is warm and dry.  Neurological:     Mental Status: She is alert.     Medications and allergies  No Known Allergies   Medication list after today's encounter   Current Outpatient Medications:    Lysine HCl 1000 MG TABS, 1 tablet Orally Once a day for 30 day(s), Disp: , Rfl:    Norgestim-Eth Estrad Triphasic (ORTHO TRI-CYCLEN, 28, PO), Take by mouth., Disp: , Rfl:    VALTREX 1 g tablet, Take 500 mg by mouth as  needed., Disp: , Rfl:    propranolol (INDERAL) 10 MG tablet, Take 1 tablet (10 mg total) by mouth 3 (three) times daily as needed. (Patient not taking: Reported on 01/27/2023), Disp: 90 tablet, Rfl: 10  Laboratory examination:   No results found for: "NA", "K", "CO2", "GLUCOSE", "BUN", "CREATININE", "CALCIUM", "EGFR", "GFRNONAA"      No data to display             No data to display          Lipid Panel No results for input(s): "CHOL", "TRIG", "LDLCALC", "VLDL", "HDL", "CHOLHDL", "LDLDIRECT" in the last 8760 hours.  HEMOGLOBIN A1C No results found for: "HGBA1C", "MPG" TSH No results for input(s): "TSH" in the last 8760 hours.  External labs:   TSH normal ESR normal CRP elevated @ 36   Radiology:    Cardiac Studies:   Echocardiogram 12/20/2022:  Left ventricle cavity is normal in size and wall thickness. Normal global  wall motion. Normal LV systolic function with EF 62%.  Normal diastolic  filling pattern.  No significant valvular abnormality.  No evidence of pulmonary hypertension.    EKG:   12/10/2022: Sinus Rhythm, rate 73 bpm. Normal axis, no ischemia.  Assessment     ICD-10-CM   1. DOE (dyspnea on exertion)  R06.09     2. Palpitations  R00.2     3. Tachycardia  R00.0     4. Elevated C-reactive protein (CRP)  R79.82        No orders of the defined types were placed in this encounter.   No orders of the defined types were placed in this encounter.   There are no discontinued medications.   Recommendations:   Jashira Cotugno is a 32 y.o.  female with DOE, palpitations, and tachycardia  DOE (dyspnea on exertion) Echocardiogram ordered  Elevated CRP Rheumatology referral provided Autoimmune lab panels ordered  Palpitations Orthostatic vital signs are negative, unlikely that she has POTS  Tachycardia Can try Propranolol 10 mg TID PRN Follow-up in 1-2 months or sooner if needed    Clotilde Dieter, DO, [facility]  01/27/2023, 3:05 PM Office: [phone] Pager: [phone]

## 2023-05-01 ENCOUNTER — Ambulatory Visit: Payer: BC Managed Care – PPO | Admitting: Internal Medicine

## 2023-05-01 ENCOUNTER — Encounter: Payer: Self-pay | Admitting: Internal Medicine

## 2023-05-01 VITALS — BP 109/75 | HR 88 | Resp 12 | Ht 65.0 in | Wt 125.0 lb

## 2023-05-01 DIAGNOSIS — R002 Palpitations: Secondary | ICD-10-CM | POA: Diagnosis not present

## 2023-05-01 DIAGNOSIS — R7982 Elevated C-reactive protein (CRP): Secondary | ICD-10-CM | POA: Diagnosis not present

## 2023-05-01 DIAGNOSIS — M79642 Pain in left hand: Secondary | ICD-10-CM

## 2023-05-01 DIAGNOSIS — M357 Hypermobility syndrome: Secondary | ICD-10-CM

## 2023-05-01 DIAGNOSIS — M79641 Pain in right hand: Secondary | ICD-10-CM | POA: Diagnosis not present

## 2023-05-01 NOTE — Patient Instructions (Addendum)
It was very nice meeting you today.  I do not see any obvious indication of an active autoimmune or inflammatory process.  You appear to have lax or hyperextensible joints in several areas. This can cause a problem known as benign joint hypermobility syndrome.

## 2023-05-01 NOTE — Progress Notes (Signed)
Office Visit Note  Patient: Mary Andrews             Date of Birth: July 05, 1991           MRN: [id number]             PCP: Jarrett Soho, PA-C Referring: Clotilde Dieter, DO Visit Date: 05/01/2023 Occupation: Teacher  Subjective:  New Patient (Initial Visit) (Patient states she has joint pain in her fingers and sometimes her knees. )   History of Present Illness: Beulah Capobianco is a 32 y.o. female here for evaluation of elevated CRP checked in association with chronic fatigue dyspnea on exertion also with some chronic joint pain in multiple areas.  She has been having a lot of trouble with decreased exertional capacity and frequent episodes of tachycardia and palpitations sometimes with simply standing or walking short distances.  Previously she has been a very active runner and almost completely limited from this now by her symptoms.  Symptoms have been new since she was sick with COVID a few years ago.  Though predating this she has had some hand tremors and migraines including workup with nerve conduction study as far back as 2017.  She saw Dr. Melton Alar with [facility] cardiology workup at their visit was found to be negative for POTS and no specific electrical abnormality found on EKG testing.  Does not accumulate peripheral edema. Joint pain most often in fingers and sometimes the knees.  She has been developing some popping and hyperextension occurring in her fingers unintentionally this is sometimes painful.  Never had any specific joint injuries fractures dislocations or surgeries.  Does not notice visible swelling or discoloration in affected areas.  Does have family history of rheumatoid arthritis.  Had a grandfather who suffered aortic dissection 31 years old.  Labs reviewed 12/2022 RF neg CCP neg ANA neg  11/2022 CRP 36  Activities of Daily Living:  Patient reports morning stiffness for 0 minute.   Patient Reports nocturnal pain.  Difficulty dressing/grooming:  Denies Difficulty climbing stairs: Denies Difficulty getting out of chair: Denies Difficulty using hands for taps, buttons, cutlery, and/or writing: Reports  Review of Systems  Constitutional:  Positive for fatigue.  HENT:  Positive for mouth dryness. Negative for mouth sores.   Eyes:  Negative for dryness.  Respiratory:  Positive for shortness of breath.   Cardiovascular:  Positive for palpitations. Negative for chest pain.  Gastrointestinal:  Negative for blood in stool, constipation and diarrhea.  Endocrine: Negative for increased urination.  Genitourinary:  Negative for involuntary urination.  Musculoskeletal:  Positive for joint pain, joint pain, myalgias, muscle tenderness and myalgias. Negative for gait problem, joint swelling, muscle weakness and morning stiffness.  Skin:  Positive for hair loss. Negative for color change, rash and sensitivity to sunlight.  Allergic/Immunologic: Negative for susceptible to infections.  Neurological:  Positive for headaches. Negative for dizziness.  Hematological:  Positive for swollen glands.  Psychiatric/Behavioral:  Positive for sleep disturbance. Negative for depressed mood. The patient is not nervous/anxious.     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   Right-handed.   No daily use of caffeine.     There is no immunization history on file for  this patient.   Objective: Vital Signs: BP 109/75 (BP Location: Right Arm, Patient Position: Sitting, Cuff Size: Normal)   Pulse 88   Resp 12   Ht 5\' 5"  (1.651 m)   Wt 125 lb (56.7 kg)   LMP 04/23/2023   BMI 20.80 kg/m    Physical Exam HENT:     Mouth/Throat:     Mouth: Mucous membranes are moist.     Pharynx: Oropharynx is clear.  Eyes:     Conjunctiva/sclera: Conjunctivae normal.  Cardiovascular:     Rate and Rhythm: Normal rate and regular rhythm.  Pulmonary:     Effort: Pulmonary effort is normal.     Breath sounds: Normal breath sounds.  Musculoskeletal:     Right lower leg: No edema.     Left lower leg: No edema.  Lymphadenopathy:     Cervical: No cervical adenopathy.  Skin:    General: Skin is warm and dry.     Findings: No rash.     Comments: Normal-appearing nailfold capillaroscopy No digital pitting or ridges No obviously abnormal skin elasticity  Neurological:     Mental Status: She is alert.  Psychiatric:        Mood and Affect: Mood normal.      Musculoskeletal Exam:  Shoulders full ROM no tenderness or swelling Elbows full ROM no tenderness or swelling Wrists full ROM no tenderness or swelling Reducible swan-neck deformity occurs with full extension right third and fourth fingers, fifth MCP joint extension greater than 90 degrees, thumbs easily opposable to forearm flexor surfaces Hip normal internal and external rotation without pain, no tenderness to lateral hip palpation Knees hyperextensible to at least past 10 degrees while locked in standing position Able to touch floor with fingers but not palms standing with straight legs   Investigation: No additional findings.  Imaging: No results found.  Recent Labs: No results found for: "WBC", "HGB", "PLT", "NA", "K", "CL", "CO2", "GLUCOSE", "BUN", "CREATININE", "BILITOT", "ALKPHOS", "AST", "ALT", "PROT", "ALBUMIN", "CALCIUM", "GFRAA", "QFTBGOLD", "QFTBGOLDPLUS"  Speciality Comments: No specialty  comments available.  Procedures:  No procedures performed Allergies: Patient has no known allergies.   Assessment / Plan:     Visit Diagnoses: CRP elevated - Plan: Sedimentation rate, C-reactive protein, IgG, IgA, IgM, Mutated Citrullinated Vimentin (MCV) Antibody  Previous elevated CRP at pretty high level in February otherwise antibody workup has been entirely negative since then with cardiology office.  I do not see any specific clinical criteria for autoimmune disease by exam or history.  Will recheck serum inflammatory markers also checking quantitative immunoglobulins and MCV antibody for any other evidence of inflammatory arthritis.  Expect these unlikely to be negative so would not recommend any targeted drug treatment.  Pain in both hands Benign joint hypermobility syndrome  I believe current joint pain is most likely related to benign joint hypermobility.  There is no appreciable synovitis on exam.  Also no crepitus or degenerative changes or previous injury to account for this.  Distribution and described symptoms are not suggestive for neuropathy. Discussed this problem and frequent  association with joint pain due to repetitive low-grade strain and sprain injuries.  Recommended her to check this out and consider addition of more isometric resistance exercise for joint protection.  Offered formal referral to physical therapy or sports medicine if needed.  Palpitations  More limiting her activity than joint problems is the tachycardia palpitations and decreased exertion tolerance.  Some of this sounds consistent with long COVID type of syndrome.  Also increased risk of POTS or chronic fatigue syndrome is more common in individuals with joint hypermobility condition.  Recommend she may need to follow-up with her cardiologist or could be good to see evaluation with Dr. Graciela Husbands or [facility] clinic.  Orders: Orders Placed This Encounter  Procedures   Sedimentation rate   C-reactive  protein   IgG, IgA, IgM   Mutated Citrullinated Vimentin (MCV) Antibody   No orders of the defined types were placed in this encounter.    Follow-Up Instructions: No follow-ups on file.   Fuller Plan, MD  Note - This record has been created using [employer].  Chart creation errors have been sought, but may not always  have been located. Such creation errors do not reflect on  the standard of medical care.

## 2023-05-04 DIAGNOSIS — R002 Palpitations: Secondary | ICD-10-CM | POA: Insufficient documentation
